# Patient Record
Sex: Female | Born: 1975 | Race: White | Hispanic: No | Marital: Single | Smoking: Current every day smoker
Health system: Southern US, Community
[De-identification: ages and names within clinical notes are randomized; demographics above are authoritative.]

---

## 2011-10-19 ENCOUNTER — Emergency Department (HOSPITAL_COMMUNITY)
Admission: EM | Admit: 2011-10-19 | Discharge: 2011-10-19 | Disposition: A | Discharge status: Home / Self Care | Payer: No Typology Code available for payment source | Attending: Emergency Medicine | Admitting: Emergency Medicine

## 2011-10-19 ENCOUNTER — Encounter (HOSPITAL_COMMUNITY): Payer: Self-pay | Admitting: *Deleted

## 2011-10-19 ENCOUNTER — Emergency Department (HOSPITAL_COMMUNITY): Payer: No Typology Code available for payment source

## 2011-10-19 DIAGNOSIS — Y998 Other external cause status: Secondary | ICD-10-CM | POA: Insufficient documentation

## 2011-10-19 DIAGNOSIS — S20219A Contusion of unspecified front wall of thorax, initial encounter: Secondary | ICD-10-CM

## 2011-10-19 DIAGNOSIS — S139XXA Sprain of joints and ligaments of unspecified parts of neck, initial encounter: Secondary | ICD-10-CM | POA: Insufficient documentation

## 2011-10-19 DIAGNOSIS — Y93I9 Activity, other involving external motion: Secondary | ICD-10-CM | POA: Insufficient documentation

## 2011-10-19 DIAGNOSIS — S161XXA Strain of muscle, fascia and tendon at neck level, initial encounter: Secondary | ICD-10-CM

## 2011-10-19 DIAGNOSIS — F17200 Nicotine dependence, unspecified, uncomplicated: Secondary | ICD-10-CM | POA: Insufficient documentation

## 2011-10-19 MED ORDER — IBUPROFEN 600 MG PO TABS: 600.0000 mg | ORAL_TABLET | Freq: Four times a day (QID) | ORAL | Status: DC | PRN

## 2011-10-19 MED ORDER — DIAZEPAM 5 MG PO TABS: 5.0000 mg | ORAL_TABLET | Freq: Four times a day (QID) | ORAL | Status: DC | PRN

## 2011-10-19 MED ORDER — OXYCODONE-ACETAMINOPHEN 5-325 MG PO TABS: 1.0000 | ORAL_TABLET | Freq: Four times a day (QID) | ORAL | Status: AC | PRN
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
## 2012-08-17 ENCOUNTER — Emergency Department (HOSPITAL_COMMUNITY): Payer: Self-pay

## 2012-08-17 ENCOUNTER — Encounter (HOSPITAL_COMMUNITY): Payer: Self-pay

## 2012-08-17 ENCOUNTER — Emergency Department (HOSPITAL_COMMUNITY)
Admission: EM | Admit: 2012-08-17 | Discharge: 2012-08-17 | Disposition: A | Discharge status: Home / Self Care | Payer: No Typology Code available for payment source | Attending: Emergency Medicine | Admitting: Emergency Medicine

## 2012-08-17 DIAGNOSIS — S8990XA Unspecified injury of unspecified lower leg, initial encounter: Secondary | ICD-10-CM | POA: Insufficient documentation

## 2012-08-17 DIAGNOSIS — S99921A Unspecified injury of right foot, initial encounter: Secondary | ICD-10-CM

## 2012-08-17 DIAGNOSIS — W010XXA Fall on same level from slipping, tripping and stumbling without subsequent striking against object, initial encounter: Secondary | ICD-10-CM | POA: Insufficient documentation

## 2012-08-17 DIAGNOSIS — IMO0002 Reserved for concepts with insufficient information to code with codable children: Secondary | ICD-10-CM | POA: Insufficient documentation

## 2012-08-17 DIAGNOSIS — Y929 Unspecified place or not applicable: Secondary | ICD-10-CM | POA: Insufficient documentation

## 2012-08-17 DIAGNOSIS — M549 Dorsalgia, unspecified: Secondary | ICD-10-CM

## 2012-08-17 DIAGNOSIS — F17200 Nicotine dependence, unspecified, uncomplicated: Secondary | ICD-10-CM | POA: Insufficient documentation

## 2012-08-17 DIAGNOSIS — Y9389 Activity, other specified: Secondary | ICD-10-CM | POA: Insufficient documentation

## 2012-08-17 MED ORDER — IBUPROFEN 800 MG PO TABS: 800.0000 mg | ORAL_TABLET | Freq: Three times a day (TID) | ORAL | Status: AC | PRN

## 2012-08-17 NOTE — ED Notes (Signed)
Patient given discharge instruction, verbalized understand. Patient ambulatory out of the department.  

## 2012-08-17 NOTE — ED Provider Notes (Signed)
History    This chart was scribed for Emily Lennert, MD by Donne Anon, ED Scribe. This patient was seen in room APA06/APA06 and the patient's care was started at 1923.  CSN: [id number] Arrival date & time 08/17/12  4098  First MD Initiated Contact with Patient 08/17/12 1923     Chief Complaint  Patient presents with  . Fall  . Back Pain  . Foot Pain    Patient is a 37 y.o. female presenting with fall, back pain, and lower extremity pain. The history is provided by the patient. No language interpreter was used.  Fall This is a new problem. The current episode started 1 to 2 hours ago. The problem has been gradually worsening. Nothing aggravates the symptoms. Nothing relieves the symptoms. She has tried nothing for the symptoms. The treatment provided no relief.  Back Pain Location:  Lumbar spine Radiates to:  Does not radiate Pain severity:  Moderate Onset quality:  Sudden Duration:  2 hours Timing:  Constant Progression:  Worsening Chronicity:  New Context: falling   Relieved by:  None tried Worsened by:  Nothing tried Ineffective treatments:  None tried Associated symptoms: no bladder incontinence, no bowel incontinence and no numbness   Foot Pain This is a new problem. The current episode started 1 to 2 hours ago. The problem occurs constantly. The problem has been gradually worsening. Nothing aggravates the symptoms. Nothing relieves the symptoms. She has tried nothing for the symptoms. The treatment provided no relief.   HPI Comments: Emily Harvey is a 37 y.o. female who presents to the Emergency Department complaining of a fall which occurred 2 hours prior to being seen. She states she slipped on a wet area on the floor and fell. She denies LOC. She complains of right foot swelling and pain as well as lower back pain.   History reviewed. No pertinent past medical history. History reviewed. No pertinent past surgical history. No family history on file. History   Substance Use Topics  . Smoking status: Current Every Day Smoker -- 2.00 packs/day for 20 years    Types: Cigarettes  . Smokeless tobacco: Not on file  . Alcohol Use: Yes   OB History   Grav Para Term Preterm Abortions TAB SAB Ect Mult Living                 Review of Systems  Constitutional: Negative for appetite change and fatigue.  HENT: Negative for congestion, sinus pressure and ear discharge.   Eyes: Negative for discharge.  Respiratory: Negative for cough.   Gastrointestinal: Negative for diarrhea and bowel incontinence.  Genitourinary: Negative for bladder incontinence, frequency and hematuria.  Musculoskeletal: Positive for back pain, joint swelling and arthralgias.  Skin: Negative for rash.  Neurological: Negative for seizures and numbness.  Psychiatric/Behavioral: Negative for hallucinations.    Allergies  Review of patient's allergies indicates no known allergies.  Home Medications   Current Outpatient Rx  Name  Route  Sig  Dispense  Refill  . diazepam (VALIUM) 5 MG tablet   Oral   Take 1 tablet (5 mg total) by mouth every 6 (six) hours as needed (spasm).   10 tablet   0   . ibuprofen (ADVIL,MOTRIN) 600 MG tablet   Oral   Take 1 tablet (600 mg total) by mouth every 6 (six) hours as needed for pain.   15 tablet   0    BP 128/70  Pulse 111  Temp(Src) 98.5 F (36.9 C) (  Oral)  Resp 20  Ht 5\' 4"  (1.626 m)  Wt 120 lb (54.432 kg)  BMI 20.59 kg/m2  SpO2 100%  LMP 08/11/2012  Physical Exam  Constitutional: She is oriented to person, place, and time. She appears well-developed.  HENT:  Head: Normocephalic.  Eyes: Conjunctivae and EOM are normal. No scleral icterus.  Neck: Neck supple. No thyromegaly present.  Cardiovascular: Normal rate and regular rhythm.  Exam reveals no gallop and no friction rub.   No murmur heard. Pulmonary/Chest: No stridor. She has no wheezes. She has no rales. She exhibits no tenderness.  Abdominal: She exhibits no  distension. There is no tenderness. There is no rebound.  Musculoskeletal: Normal range of motion. She exhibits no edema.       Lumbar back: She exhibits tenderness.  Minor bruising to top of right foot. Minor tenderness to lumbar spine.  Lymphadenopathy:    She has no cervical adenopathy.  Neurological: She is oriented to person, place, and time. Coordination normal.  Skin: No rash noted. No erythema.  Psychiatric: She has a normal mood and affect. Her behavior is normal.    ED Course  Procedures (including critical care time) DIAGNOSTIC STUDIES: Oxygen Saturation is 100% on RA, normal by my interpretation.    COORDINATION OF CARE: 7:26 PM Discussed treatment plan which includes lumbar xray and right foot xray with pt at bedside and pt agreed to plan.   8:27 PM Rechecked pt. Discussed imaging results. Will discharge home.  Dg Lumbar Spine Complete  08/17/2012   *RADIOLOGY REPORT*  Clinical Data: Fall with lower back pain.  LUMBAR SPINE - COMPLETE 4+ VIEW  Comparison: None.  Findings: Negative for acute fracture, traumatic subluxation, or other acute osseous abnormality.  Mild L5-L1 retrolisthesis, likely preexisting in the absence of fracture.  There are numerous peripherally calcified gallstones.  Three gallstones project medial and posterior to the expected location of the gallbladder, and may be within the biliary tree - CBD.  IMPRESSION:  1.  Negative for acute fracture. 2.  Mild L5-S1 retrolisthesis without significant degenerative changes. 3.  Cholelithiasis; possible choledocholithiasis. Recommend follow up, to include [place].   Original Report Authenticated By: Tiburcio Pea   Dg Foot Complete Right  08/17/2012   *RADIOLOGY REPORT*  Clinical Data: Fall with foot pain.  RIGHT FOOT COMPLETE - 3+ VIEW  Comparison: None.  Findings: Negative for fracture or acute malalignment.  No radiodense foreign body.  IMPRESSION: Negative right foot.   Original Report Authenticated By: Tiburcio Pea       Labs Reviewed - No data to display No results found. No diagnosis found.  MDM    The chart was scribed for me under my direct supervision.  I personally performed the history, physical, and medical decision making and all procedures in the evaluation of this patient.Emily Lennert, MD 08/17/12 2032

## 2012-08-17 NOTE — ED Notes (Signed)
I fell at [employer] 2 hours ago. I am having pain and swelling in my right foot, and having pain in my legs and in my lower back per pt. I went to get a wipe and there was a spill in the floor near the trash and my feet went out from under me per pt.

## 2014-07-28 IMAGING — CR DG LUMBAR SPINE COMPLETE 4+V
5 series · 5 of 5 positions shown · non-contrast
Comparison: None.

CLINICAL DATA: Fall with lower back pain.

LUMBAR SPINE - COMPLETE 4+ VIEW

[view not recorded (1 of 5)]
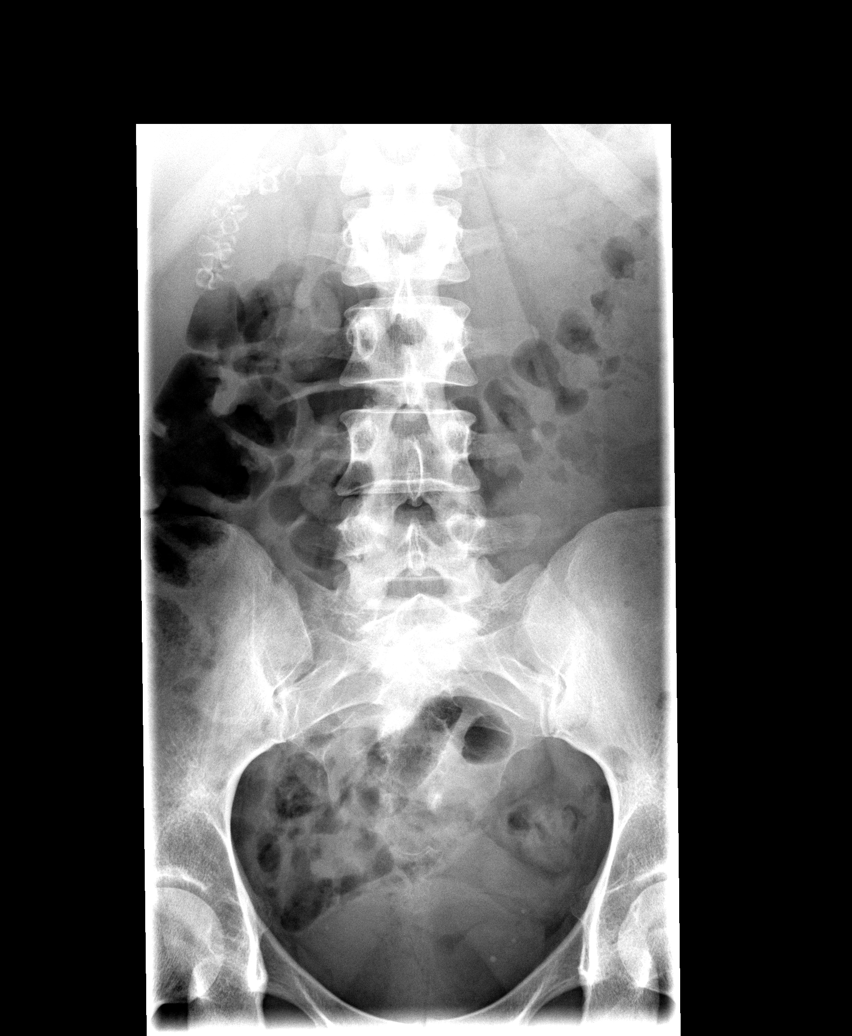

[view not recorded (2 of 5)]
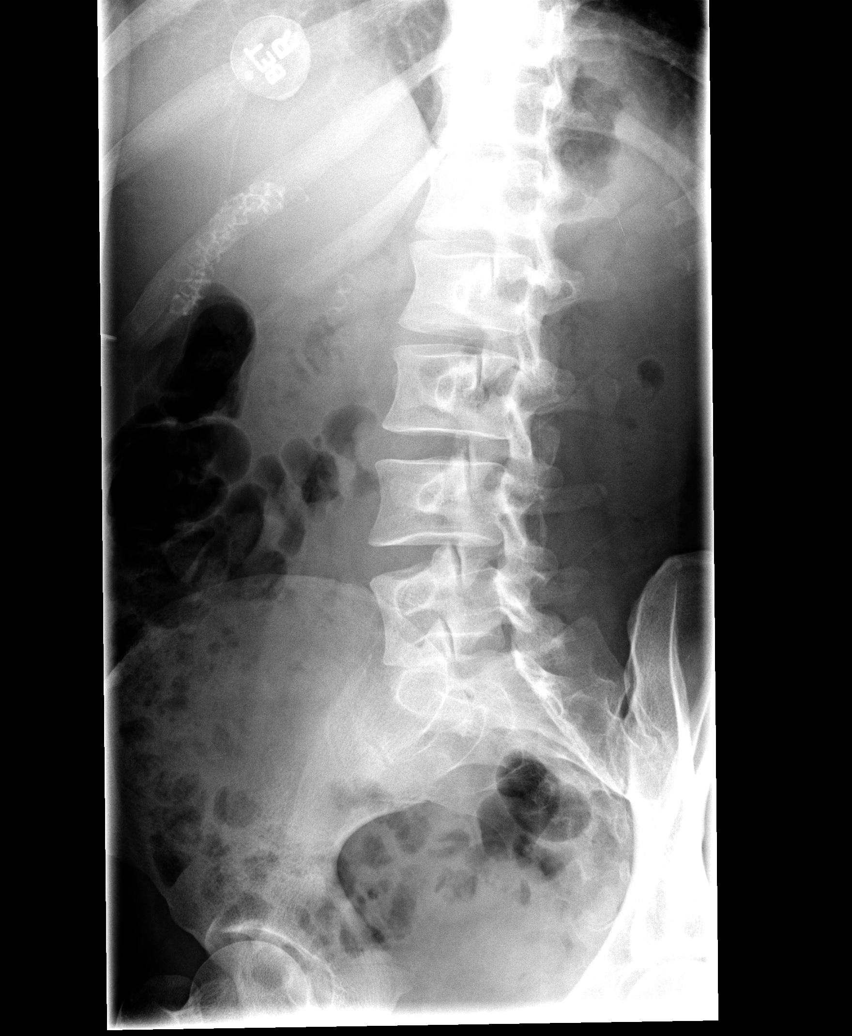

[view not recorded (3 of 5)]
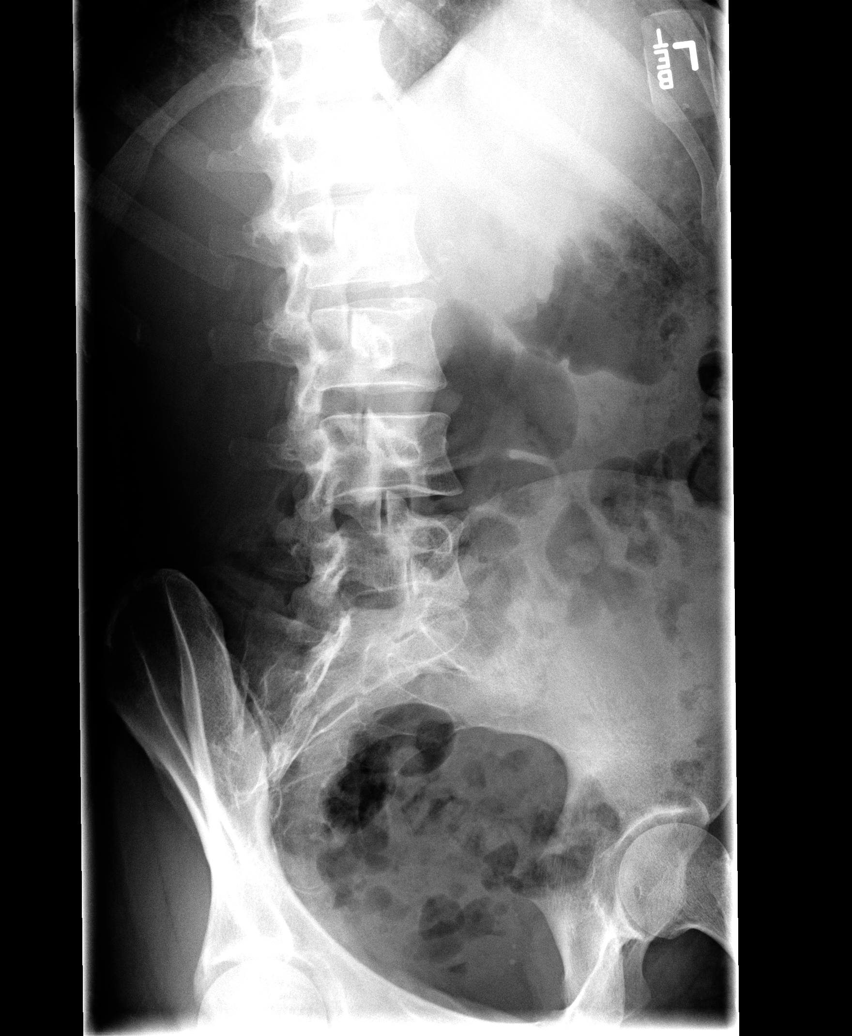

[view not recorded (4 of 5)]
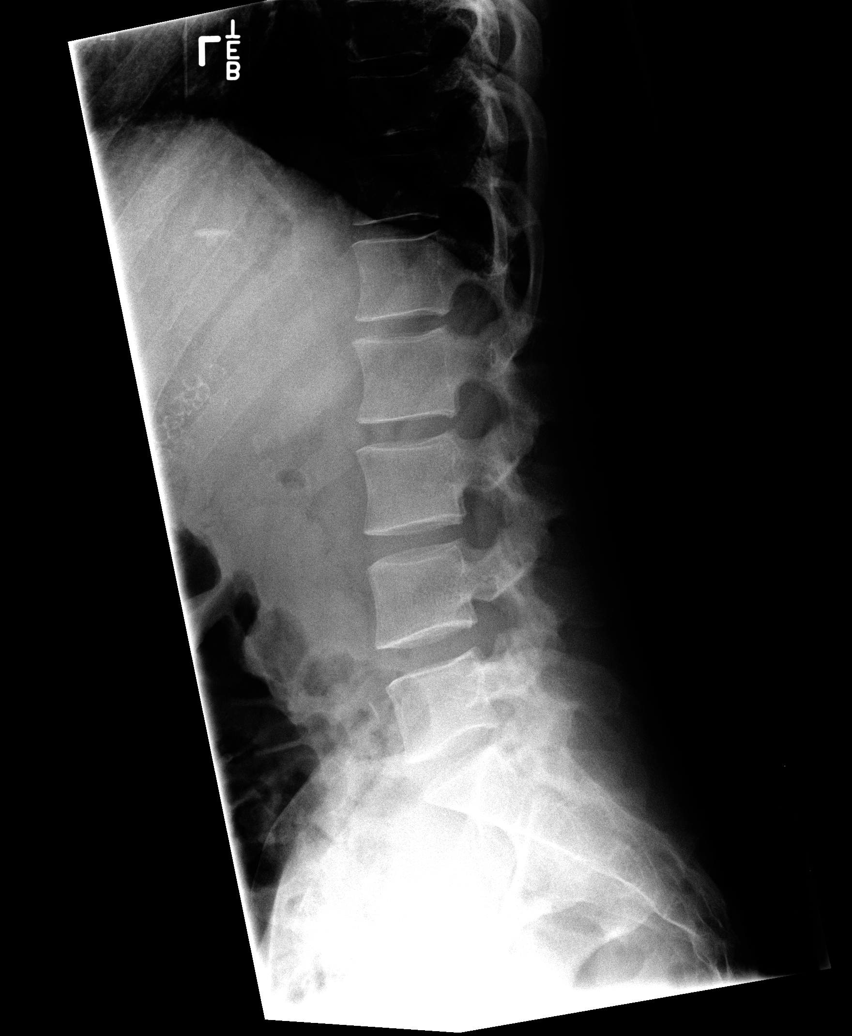

[view not recorded (5 of 5)]
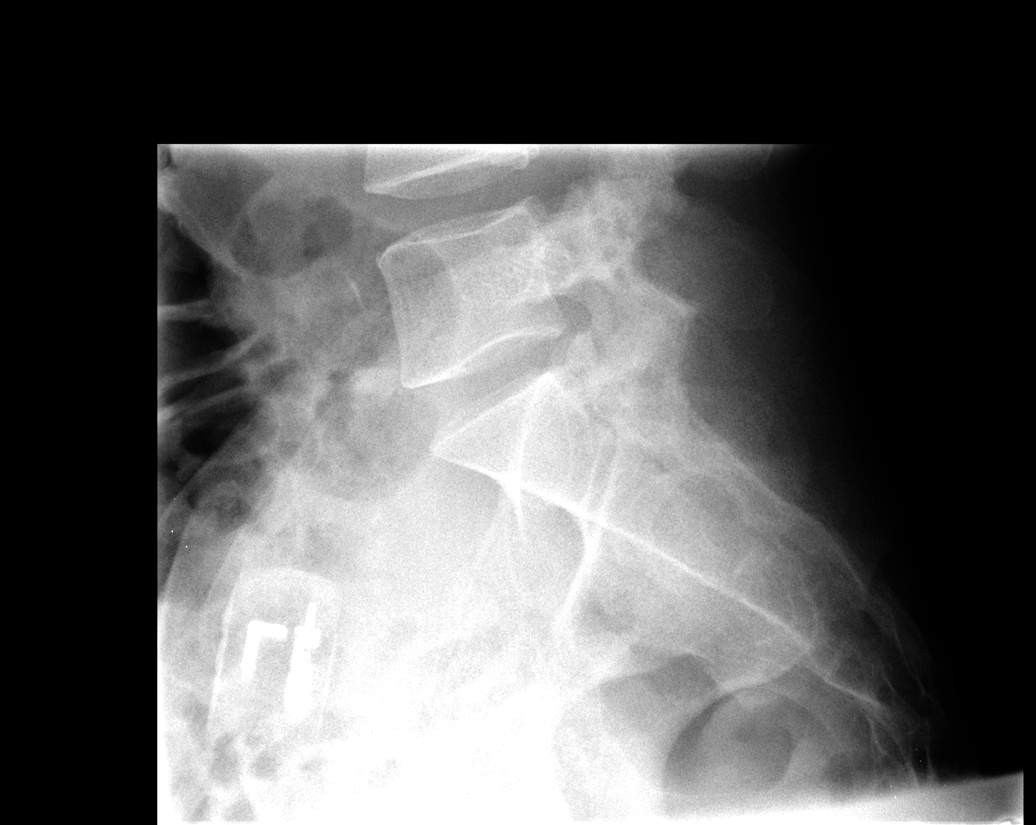

[5 of 5 positions shown; findings below may reference images not displayed]

FINDINGS: Negative for acute fracture, traumatic subluxation, or
other acute osseous abnormality.  Mild L5-L1 retrolisthesis, likely
preexisting in the absence of fracture.

There are numerous peripherally calcified gallstones.  Three
gallstones project medial and posterior to the expected location of
the gallbladder, and may be within the biliary tree - CBD.
IMPRESSION: 1.  Negative for acute fracture.
2.  Mild L5-S1 retrolisthesis without significant degenerative
changes.
3.  Cholelithiasis; possible choledocholithiasis. Recommend follow
up, to include [place].

## 2016-02-07 ENCOUNTER — Encounter (HOSPITAL_COMMUNITY): Payer: Self-pay

## 2016-02-07 ENCOUNTER — Emergency Department (HOSPITAL_COMMUNITY)
Admission: EM | Admit: 2016-02-07 | Discharge: 2016-02-07 | Disposition: A | Discharge status: Home / Self Care | Payer: No Typology Code available for payment source | Attending: Emergency Medicine | Admitting: Emergency Medicine

## 2016-02-07 DIAGNOSIS — Y9389 Activity, other specified: Secondary | ICD-10-CM | POA: Insufficient documentation

## 2016-02-07 DIAGNOSIS — Y999 Unspecified external cause status: Secondary | ICD-10-CM | POA: Insufficient documentation

## 2016-02-07 DIAGNOSIS — Y92410 Unspecified street and highway as the place of occurrence of the external cause: Secondary | ICD-10-CM | POA: Diagnosis not present

## 2016-02-07 DIAGNOSIS — S161XXA Strain of muscle, fascia and tendon at neck level, initial encounter: Secondary | ICD-10-CM | POA: Insufficient documentation

## 2016-02-07 DIAGNOSIS — F17210 Nicotine dependence, cigarettes, uncomplicated: Secondary | ICD-10-CM | POA: Diagnosis not present

## 2016-02-07 DIAGNOSIS — S8002XA Contusion of left knee, initial encounter: Secondary | ICD-10-CM | POA: Insufficient documentation

## 2016-02-07 DIAGNOSIS — S3992XA Unspecified injury of lower back, initial encounter: Secondary | ICD-10-CM | POA: Diagnosis present

## 2016-02-07 DIAGNOSIS — S39012A Strain of muscle, fascia and tendon of lower back, initial encounter: Secondary | ICD-10-CM | POA: Diagnosis not present

## 2016-02-07 MED ORDER — CYCLOBENZAPRINE HCL 10 MG PO TABS: 10.0000 mg | ORAL_TABLET | Freq: Three times a day (TID) | ORAL | 0 refills | Status: AC | PRN

## 2016-02-07 MED ORDER — DICLOFENAC SODIUM 75 MG PO TBEC: 75.0000 mg | DELAYED_RELEASE_TABLET | Freq: Two times a day (BID) | ORAL | 0 refills | Status: AC
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
## 2016-02-07 NOTE — Discharge Instructions (Signed)
Wear the ace wrap as needed when walking, but do not wear it continuously.  Alternate ice and heat to your neck and back.  Follow-up with the orthopedic doctor listed in one week if not improving

## 2016-02-07 NOTE — ED Notes (Signed)
Pt alert & oriented x4, stable gait. Patient given discharge instructions, paperwork & prescription(s). Patient  instructed to stop at the registration desk to finish any additional paperwork. Patient verbalized understanding. Pt left department w/ no further questions. 
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
